# Patient Record
Sex: Male | Born: 1990 | Hispanic: No | Marital: Single | Smoking: Former smoker
Health system: Southern US, Community
[De-identification: ages and names within clinical notes are randomized; demographics above are authoritative.]

## PROBLEM LIST (undated history)

## (undated) DIAGNOSIS — K219 Gastro-esophageal reflux disease without esophagitis: Secondary | ICD-10-CM

## (undated) DIAGNOSIS — I499 Cardiac arrhythmia, unspecified: Secondary | ICD-10-CM

## (undated) DIAGNOSIS — F32A Depression, unspecified: Secondary | ICD-10-CM

## (undated) DIAGNOSIS — F419 Anxiety disorder, unspecified: Secondary | ICD-10-CM

## (undated) HISTORY — DX: Depression, unspecified: F32.A

## (undated) HISTORY — DX: Cardiac arrhythmia, unspecified: I49.9

## (undated) HISTORY — DX: Gastro-esophageal reflux disease without esophagitis: K21.9

## (undated) HISTORY — PX: FRACTURE SURGERY: SHX138

---

## 2019-08-25 ENCOUNTER — Other Ambulatory Visit: Payer: Self-pay

## 2019-08-25 ENCOUNTER — Encounter (HOSPITAL_COMMUNITY): Payer: Self-pay | Admitting: Emergency Medicine

## 2019-08-25 ENCOUNTER — Emergency Department (HOSPITAL_COMMUNITY)
Admission: EM | Admit: 2019-08-25 | Discharge: 2019-08-25 | Disposition: A | Discharge status: Home / Self Care | Payer: Medicaid - Out of State | Attending: Emergency Medicine | Admitting: Emergency Medicine

## 2019-08-25 DIAGNOSIS — F419 Anxiety disorder, unspecified: Secondary | ICD-10-CM | POA: Insufficient documentation

## 2019-08-25 DIAGNOSIS — R0989 Other specified symptoms and signs involving the circulatory and respiratory systems: Secondary | ICD-10-CM | POA: Diagnosis not present

## 2019-08-25 HISTORY — DX: Anxiety disorder, unspecified: F41.9

## 2019-08-25 MED ORDER — HYDROXYZINE HCL 25 MG PO TABS
50.0000 mg | ORAL_TABLET | Freq: Once | ORAL | Status: AC
  Administered 2019-08-25: 50 mg via ORAL
  Filled 2019-08-25: qty 2

## 2019-08-25 MED ORDER — HYDROXYZINE HCL 25 MG PO TABS: 25.0000 mg | ORAL_TABLET | Freq: Four times a day (QID) | ORAL | 0 refills | Status: AC

## 2019-08-25 NOTE — ED Triage Notes (Signed)
Pt st's he had a period of feeling like he couldn't breath about 40 mins ago.  Pt st's it only lasted for a min.  Pt st's he doesn't know if he was having an anxiety attack or allergic reaction to something.   Pt st's he feels much better now

## 2019-08-25 NOTE — Discharge Instructions (Signed)
As discussed, your symptoms are most likely related to an anxiety attack.  I am sending you home with some anxiety medication.  Please take as prescribed and as needed for anxiety.  I have included the number for [facility].  Please call tomorrow to schedule an appointment for further evaluation.  Return to the ER for new or worsening symptoms.

## 2019-08-25 NOTE — ED Provider Notes (Signed)
[facility] EMERGENCY DEPARTMENT Provider Note   CSN: [id number] Arrival date & time: 08/25/19  1530     History Chief Complaint  Patient presents with  . Anxiety    John Prince is a 29 y.o. male with a past medical history significant for anxiety who presents to the ED due to an anxiety attack that occurred 40 minutes prior to arrival.  Patient states he was sitting at his computer when he felt sudden onset of shortness of breath and felt like his throat was closing up.  Symptoms lasted roughly 10 minutes.  Patient admits to a history of anxiety in which this feels similar to his past anxiety attacks.  He was also concerned about a possible allergic reaction.  Denies any new foods, new medicines, new lotions, or new soaps.  Denies associated rash.  Patient notes his symptoms have completely resolved at this point.  [place] and is not currently on any anxiety medication.  Denies abdominal pain, nausea, vomiting, diarrhea.  Denies chest pain and lower extremity edema.  Denies history of blood clots, recent surgeries, recent long immobilizations, and hormonal treatments.  History obtained from patient and past medical records. No interpreter used during encounter.      Past Medical History:  Diagnosis Date  . Anxiety     There are no problems to display for this patient.   Past Surgical History:  Procedure Laterality Date  . FRACTURE SURGERY         No family history on file.  Social History   Tobacco Use  . Smoking status: Former Research scientist (life sciences)  . Smokeless tobacco: Never Used  Substance Use Topics  . Alcohol use: Yes    Comment: rarely  . Drug use: Never    Home Medications Prior to Admission medications   Medication Sig Start Date End Date Taking? Authorizing Provider  hydrOXYzine (ATARAX/VISTARIL) 25 MG tablet Take 1 tablet (25 mg total) by mouth every 6 (six) hours. 08/25/19   Suzy Bouchard, PA-C     Allergies    Patient has no known allergies.  Review of Systems   Review of Systems  Constitutional: Negative for chills and fever.  HENT: Positive for sore throat (felt like throat was closing up).   Respiratory: Positive for chest tightness and shortness of breath (resolved). Negative for cough.   Cardiovascular: Negative for chest pain and leg swelling.  Gastrointestinal: Negative for abdominal pain, diarrhea, nausea and vomiting.  Skin: Negative for rash.  Neurological: Negative for headaches.  All other systems reviewed and are negative.   Physical Exam Updated Vital Signs BP 130/88   Pulse 73   Temp 98.3 F (36.8 C) (Oral)   Resp 16   Ht 5\' 11"  (1.803 m)   Wt 70.3 kg   SpO2 100%   BMI 21.62 kg/m   Physical Exam Vitals and nursing note reviewed.  Constitutional:      General: He is not in acute distress.    Appearance: He is not ill-appearing.  HENT:     Head: Normocephalic.     Mouth/Throat:     Comments: Patent airway. No tonsillar hypertrophy.  Eyes:     Pupils: Pupils are equal, round, and reactive to light.  Cardiovascular:     Rate and Rhythm: Normal rate and regular rhythm.     Pulses: Normal pulses.     Heart sounds: Normal heart sounds. No murmur. No friction rub. No gallop.  Pulmonary:     Effort: Pulmonary effort is normal.     Breath sounds: Normal breath sounds.     Comments: Respirations equal and unlabored, patient able to speak in full sentences, lungs clear to auscultation bilaterally Abdominal:     General: Abdomen is flat. There is no distension.     Palpations: Abdomen is soft.     Tenderness: There is no abdominal tenderness. There is no guarding or rebound.  Musculoskeletal:     Cervical back: Neck supple.     Comments: No lower extremity edema. Negative homan sign bilaterally.   Skin:    General: Skin is warm and dry.     Findings: No rash.  Neurological:     General: No focal deficit present.     Mental Status: He is  alert.  Psychiatric:        Mood and Affect: Mood normal.        Behavior: Behavior normal.     ED Results / Procedures / Treatments   Labs (all labs ordered are listed, but only abnormal results are displayed) Labs Reviewed - No data to display  EKG None  Radiology No results found.  Procedures Procedures (including critical care time)  Medications Ordered in ED Medications  hydrOXYzine (ATARAX/VISTARIL) tablet 50 mg (50 mg Oral Given 08/25/19 1712)    ED Course  I have reviewed the triage vital signs and the nursing notes.  Pertinent labs & imaging results that were available during my care of the patient were reviewed by me and considered in my medical decision making (see chart for details).    MDM Rules/Calculators/A&P                     29 year old male presents to the ED for an evaluation of an anxiety attack that occurred 40 minutes prior to arrival.  Patient states he had sudden onset of shortness of breath and felt like his throat was closing up.  Denies associated rash.  Patient has a history of anxiety and notes this feels similar to his past anxiety attacks.  He recently [place] here from [place] of state and is not currently on any anxiety medication.  Upon arrival, vitals all within normal limits.  Patient is afebrile, not tachycardic or hypoxic.  Patient no acute distress and non-ill-appearing.  Physical exam reassuring.  Lungs clear to auscultation bilaterally.  Airway patent.  No rash.  Abdomen soft, nondistended, nontender.  Low suspicion for allergic reaction at this time.  Will treat for anxiety and reassess patient. Low suspicion for PE/DVT or allergic reaction at this time.  5:57 PM reassessed patient at bedside and he notes that he is ready to be discharged. He admits to improvement in symptoms after hydroxyzine. Will discharge patient with a prescription for hydroxyzine. [facility] number given to patient. Advised patient to call [facility] tomorrow to  schedule an appointment for further evaluation of anxiety. Patient denies SI/HI and hallucinations. No concern for psychosis at this time. Strict ED precautions discussed with patient. Patient states understanding and agrees to plan. Patient discharged home in no acute distress and stable vitals.  Final Clinical Impression(s) / ED Diagnoses Final diagnoses:  Anxiety    Rx / DC Orders ED Discharge Orders         Ordered    hydrOXYzine (ATARAX/VISTARIL) 25 MG tablet  Every 6 hours     08/25/19 1709           Noland Pizano, Merla Riches,  PA-C 08/25/19 1800    Eber Hong, MD 08/26/19 1459

## 2019-11-16 ENCOUNTER — Encounter: Payer: Self-pay | Admitting: Nurse Practitioner

## 2019-11-16 ENCOUNTER — Other Ambulatory Visit: Payer: Self-pay

## 2019-11-16 ENCOUNTER — Ambulatory Visit (INDEPENDENT_AMBULATORY_CARE_PROVIDER_SITE_OTHER): Payer: PRIVATE HEALTH INSURANCE | Admitting: Nurse Practitioner

## 2019-11-16 VITALS — BP 109/72 | HR 85 | Temp 98.8°F | Ht 71.0 in | Wt 142.0 lb

## 2019-11-16 DIAGNOSIS — F419 Anxiety disorder, unspecified: Secondary | ICD-10-CM

## 2019-11-16 DIAGNOSIS — Z0000 Encounter for general adult medical examination without abnormal findings: Secondary | ICD-10-CM | POA: Diagnosis not present

## 2019-11-16 LAB — POCT URINALYSIS DIPSTICK
Bilirubin, UA: NEGATIVE
Blood, UA: NEGATIVE
Glucose, UA: NEGATIVE
Ketones, UA: NEGATIVE
Leukocytes, UA: NEGATIVE
Nitrite, UA: NEGATIVE
Protein, UA: NEGATIVE
Spec Grav, UA: <=1.005 — AB (ref 1.010–1.025)
Urobilinogen, UA: 1 E.U./dL
pH, UA: 5.5 (ref 5.0–8.0)

## 2019-11-16 LAB — POCT GLYCOSYLATED HEMOGLOBIN (HGB A1C)
HbA1c POC (<> result, manual entry): 5 % (ref 4.0–5.6)
HbA1c, POC (controlled diabetic range): 5 % (ref 0.0–7.0)
HbA1c, POC (prediabetic range): 5 % — AB (ref 5.7–6.4)
Hemoglobin A1C: 5 % (ref 4.0–5.6)

## 2019-11-16 LAB — GLUCOSE, POCT (MANUAL RESULT ENTRY): POC Glucose: 103 mg/dl — AB (ref 70–99)

## 2019-11-16 NOTE — Patient Instructions (Signed)

## 2019-11-16 NOTE — Progress Notes (Signed)
John Prince Patient [facility] [address] [place], [place]  [address] Phone:  [phone]   Fax:  [phone]   New Patient Office Visit  Subjective:  Patient ID: John Prince, male    DOB: [birth year]  Age: 29 y.o. MRN: [id number]  CC:  Chief Complaint  Patient presents with   Establish Care    no concerns, just moved from [place].     HPI John Prince presents to establish care. He  has a past medical history of Anxiety, Depression, GERD (gastroesophageal reflux disease), and Irregular heartbeat.   Patient presents for re-evaluation of heart . Patient's current complaints are irregular heart beat. He denies chest pain, dyspnea, lower extremity edema and syncope. He states he is Currently not on any formal treatment. He states he is compliant most of the time with his diet.  Anxiety Patient is here for evaluation of anxiety.  He has the following anxiety symptoms: palpitations, shortness of breath. Onset of symptoms was approximately several years ago.  Symptoms have been gradually worsening since that time. He denies current suicidal and homicidal ideation. Family history significant for depression.Possible organic causes contributing are: none. Risk factors: positive family history in  dad (Bipolar) and cousin Previous treatment includes medication several have caused fogginess. He has taken alprazalam. He is currently on hydroxyzine 25 mg 4 times daily as needed.   He complains of the following medication side effects: none.  He is currently working and is requesting accommodation paperwork to be completed for anxiety flareups  Past Medical History:  Diagnosis Date   Anxiety    Depression    GERD (gastroesophageal reflux disease)    Irregular heartbeat     Past Surgical History:  Procedure Laterality Date   FRACTURE SURGERY      Family History  Problem Relation Age of Onset   Diabetes Maternal Grandmother     Social History   Socioeconomic History    Marital status: Single    Spouse name: Not on file   Number of children: Not on file   Years of education: Not on file   Highest education level: Not on file  Occupational History   Not on file  Tobacco Use   Smoking status: Former Smoker   Smokeless tobacco: Never Used  Substance and Sexual Activity   Alcohol use: Yes    Comment: rarely   Drug use: Never   Sexual activity: Yes    Birth control/protection: None  Other Topics Concern   Not on file  Social History Narrative   Not on file   Social Determinants of Health   Financial Resource Strain:    Difficulty of Paying Living Expenses:   Food Insecurity:    Worried About Programme researcher, broadcasting/film/video in the Last Year:    Barista in the Last Year:   Transportation Needs:    Freight forwarder (Medical):    Lack of Transportation (Non-Medical):   Physical Activity:    Days of Exercise per Week:    Minutes of Exercise per Session:   Stress:    Feeling of Stress :   Social Connections:    Frequency of Communication with Friends and Family:    Frequency of Social Gatherings with Friends and Family:    Attends Religious Services:    Active Member of Clubs or Organizations:    Attends Banker Meetings:    Marital Status:   Intimate Partner Violence:    Fear  of Current or Ex-Partner:    Emotionally Abused:    Physically Abused:    Sexually Abused:     ROS Review of Systems  All other systems reviewed and are negative.   Objective:   Today's Vitals: BP 109/72    Pulse 85    Temp 98.8 F (37.1 C)    Ht 5\' 11"  (1.803 m)    Wt 142 lb 0.4 oz (64.4 kg)    SpO2 99%    BMI 19.81 kg/m   Physical Exam Constitutional:      General: He is not in acute distress.    Appearance: He is normal weight.  HENT:     Nose: Nose normal.     Mouth/Throat:     Mouth: Mucous membranes are moist.  Cardiovascular:     Rate and Rhythm: Normal rate and regular rhythm.     Pulses: Normal  pulses.     Heart sounds: Normal heart sounds.  Pulmonary:     Effort: Pulmonary effort is normal.     Breath sounds: Normal breath sounds.  Abdominal:     General: Abdomen is flat. Bowel sounds are normal.     Palpations: Abdomen is soft.  Musculoskeletal:        General: Normal range of motion.     Cervical back: Normal range of motion.  Skin:    General: Skin is warm and dry.     Comments: Multiple tattoos  Neurological:     General: No focal deficit present.     Mental Status: He is alert and oriented to person, place, and time.  Psychiatric:        Mood and Affect: Mood normal.        Behavior: Behavior normal.        Thought Content: Thought content normal.        Judgment: Judgment normal.     Assessment & Plan:   Problem List Items Addressed This Visit    None    Visit Diagnoses    Healthcare maintenance    -  Primary   Relevant Orders   Urinalysis Dipstick (Completed)   Glucose (CBG) (Completed)   HgB A1c (Completed)   CBC with Differential/Platelet (Completed)   Comp. Metabolic Panel (12) (Completed)   TSH (Completed)   Anxiety       Relevant Orders   Ambulatory referral to Psychiatry Work accommodations papers to be completed for 2 additional 15-minute breaks during the day and once monthly excused absence Continue with hydroxyzine as directed      Outpatient Encounter Medications as of 11/16/2019  Medication Sig   hydrOXYzine (ATARAX/VISTARIL) 25 MG tablet Take 1 tablet (25 mg total) by mouth every 6 (six) hours.   No facility-administered encounter medications on file as of 11/16/2019.    Follow-up: Return in about 6 weeks (around 12/28/2019) for medical record release.   12/30/2019, NP

## 2019-11-17 LAB — COMP. METABOLIC PANEL (12)
AST: 15 IU/L (ref 0–40)
Albumin/Globulin Ratio: 1.9 (ref 1.2–2.2)
Albumin: 5 g/dL (ref 4.1–5.2)
Alkaline Phosphatase: 77 IU/L (ref 48–121)
BUN/Creatinine Ratio: 6 — ABNORMAL LOW (ref 9–20)
BUN: 6 mg/dL (ref 6–20)
Bilirubin Total: 0.5 mg/dL (ref 0.0–1.2)
Calcium: 9.7 mg/dL (ref 8.7–10.2)
Chloride: 102 mmol/L (ref 96–106)
Creatinine, Ser: 0.97 mg/dL (ref 0.76–1.27)
GFR calc Af Amer: 122 mL/min/{1.73_m2} (ref >59)
GFR calc non Af Amer: 106 mL/min/{1.73_m2} (ref >59)
Globulin, Total: 2.6 g/dL (ref 1.5–4.5)
Glucose: 94 mg/dL (ref 65–99)
Potassium: 4.5 mmol/L (ref 3.5–5.2)
Sodium: 140 mmol/L (ref 134–144)
Total Protein: 7.6 g/dL (ref 6.0–8.5)

## 2019-11-17 LAB — CBC WITH DIFFERENTIAL/PLATELET
Basophils Absolute: 0.1 10*3/uL (ref 0.0–0.2)
Basos: 1 %
EOS (ABSOLUTE): 0.2 10*3/uL (ref 0.0–0.4)
Eos: 3 %
Hematocrit: 44.7 % (ref 37.5–51.0)
Hemoglobin: 15.3 g/dL (ref 13.0–17.7)
Immature Grans (Abs): 0 10*3/uL (ref 0.0–0.1)
Immature Granulocytes: 1 %
Lymphocytes Absolute: 2.4 10*3/uL (ref 0.7–3.1)
Lymphs: 40 %
MCH: 30.7 pg (ref 26.6–33.0)
MCHC: 34.2 g/dL (ref 31.5–35.7)
MCV: 90 fL (ref 79–97)
Monocytes Absolute: 0.6 10*3/uL (ref 0.1–0.9)
Monocytes: 11 %
Neutrophils Absolute: 2.7 10*3/uL (ref 1.4–7.0)
Neutrophils: 44 %
Platelets: 258 10*3/uL (ref 150–450)
RBC: 4.99 x10E6/uL (ref 4.14–5.80)
RDW: 13.3 % (ref 11.6–15.4)
WBC: 6 10*3/uL (ref 3.4–10.8)

## 2019-11-17 LAB — TSH: TSH: 1.22 u[IU]/mL (ref 0.450–4.500)

## 2019-12-26 ENCOUNTER — Ambulatory Visit: Payer: PRIVATE HEALTH INSURANCE | Admitting: Nurse Practitioner

## 2019-12-28 ENCOUNTER — Encounter: Payer: Self-pay | Admitting: Nurse Practitioner

## 2019-12-28 ENCOUNTER — Ambulatory Visit (INDEPENDENT_AMBULATORY_CARE_PROVIDER_SITE_OTHER): Payer: Self-pay | Admitting: Nurse Practitioner

## 2019-12-28 ENCOUNTER — Other Ambulatory Visit: Payer: Self-pay

## 2019-12-28 VITALS — BP 111/63 | HR 58 | Temp 98.6°F | Resp 20 | Ht 70.0 in | Wt 145.2 lb

## 2019-12-28 DIAGNOSIS — F419 Anxiety disorder, unspecified: Secondary | ICD-10-CM

## 2019-12-28 DIAGNOSIS — K219 Gastro-esophageal reflux disease without esophagitis: Secondary | ICD-10-CM

## 2019-12-28 MED ORDER — OMEPRAZOLE 20 MG PO CPDR: 20.0000 mg | DELAYED_RELEASE_CAPSULE | Freq: Every day | ORAL | 3 refills | Status: DC

## 2019-12-28 MED ORDER — OMEPRAZOLE 20 MG PO CPDR: 20.0000 mg | DELAYED_RELEASE_CAPSULE | Freq: Every day | ORAL | 3 refills | Status: AC

## 2019-12-28 NOTE — Patient Instructions (Addendum)
Healthy Eating Following a healthy eating pattern may help you to achieve and maintain a healthy body weight, reduce the risk of chronic disease, and live a long and productive life. It is important to follow a healthy eating pattern at an appropriate calorie level for your body. Your nutritional needs should be met primarily through food by choosing a variety of nutrient-rich foods. What are tips for following this plan? Reading food labels  Read labels and choose the following: ? Reduced or low sodium. ? Juices with 100% fruit juice. ? Foods with low saturated fats and high polyunsaturated and monounsaturated fats. ? Foods with whole grains, such as whole wheat, cracked wheat, brown rice, and wild rice. ? Whole grains that are fortified with folic acid. This is recommended for women who are pregnant or who want to become pregnant.  Read labels and avoid the following: ? Foods with a lot of added sugars. These include foods that contain brown sugar, corn sweetener, corn syrup, dextrose, fructose, glucose, high-fructose corn syrup, honey, invert sugar, lactose, malt syrup, maltose, molasses, raw sugar, sucrose, trehalose, or turbinado sugar.  Do not eat more than the following amounts of added sugar per day:  6 teaspoons (25 g) for women.  9 teaspoons (38 g) for men. ? Foods that contain processed or refined starches and grains. ? Refined grain products, such as white flour, degermed cornmeal, white bread, and white rice. Shopping  Choose nutrient-rich snacks, such as vegetables, whole fruits, and nuts. Avoid high-calorie and high-sugar snacks, such as potato chips, fruit snacks, and candy.  Use oil-based dressings and spreads on foods instead of solid fats such as butter, stick margarine, or cream cheese.  Limit pre-made sauces, mixes, and "instant" products such as flavored rice, instant noodles, and ready-made pasta.  Try more plant-protein sources, such as tofu, tempeh, black beans,  edamame, lentils, nuts, and seeds.  Explore eating plans such as the Mediterranean diet or vegetarian diet. Cooking  Use oil to saut or stir-fry foods instead of solid fats such as butter, stick margarine, or lard.  Try baking, boiling, grilling, or broiling instead of frying.  Remove the fatty part of meats before cooking.  Steam vegetables in water or broth. Meal planning   At meals, imagine dividing your plate into fourths: ? One-half of your plate is fruits and vegetables. ? One-fourth of your plate is whole grains. ? One-fourth of your plate is protein, especially lean meats, poultry, eggs, tofu, beans, or nuts.  Include low-fat dairy as part of your daily diet. Lifestyle  Choose healthy options in all settings, including home, work, school, restaurants, or stores.  Prepare your food safely: ? Wash your hands after handling raw meats. ? Keep food preparation surfaces clean by regularly washing with hot, soapy water. ? Keep raw meats separate from ready-to-eat foods, such as fruits and vegetables. ? Cook seafood, meat, poultry, and eggs to the recommended internal temperature. ? Store foods at safe temperatures. In general:  Keep cold foods at 59F (4.4C) or below.  Keep hot foods at 159F (60C) or above.  Keep your freezer at [facility] (-17.8C) or below.  Foods are no longer safe to eat when they have been between the temperatures of 40-159F (4.4-60C) for more than 2 hours. What foods should I eat? Fruits Aim to eat 2 cup-equivalents of fresh, canned (in natural juice), or frozen fruits each day. Examples of 1 cup-equivalent of fruit include 1 small apple, 8 large strawberries, 1 cup canned fruit,  cup  dried fruit, or 1 cup 100% juice. Vegetables Aim to eat 2-3 cup-equivalents of fresh and frozen vegetables each day, including different varieties and colors. Examples of 1 cup-equivalent of vegetables include 2 medium carrots, 2 cups raw, leafy greens, 1 cup chopped  vegetable (raw or cooked), or 1 medium baked potato. Grains Aim to eat 6 ounce-equivalents of whole grains each day. Examples of 1 ounce-equivalent of grains include 1 slice of bread, 1 cup ready-to-eat cereal, 3 cups popcorn, or  cup cooked rice, pasta, or cereal. Meats and other proteins Aim to eat 5-6 ounce-equivalents of protein each day. Examples of 1 ounce-equivalent of protein include 1 egg, 1/2 cup nuts or seeds, or 1 tablespoon (16 g) peanut butter. A cut of meat or fish that is the size of a deck of cards is about 3-4 ounce-equivalents.  Of the protein you eat each week, try to have at least 8 ounces come from seafood. This includes salmon, trout, herring, and anchovies. Dairy Aim to eat 3 cup-equivalents of fat-free or low-fat dairy each day. Examples of 1 cup-equivalent of dairy include 1 cup (240 mL) milk, 8 ounces (250 g) yogurt, 1 ounces (44 g) natural cheese, or 1 cup (240 mL) fortified soy milk. Fats and oils  Aim for about 5 teaspoons (21 g) per day. Choose monounsaturated fats, such as canola and olive oils, avocados, peanut butter, and most nuts, or polyunsaturated fats, such as sunflower, corn, and soybean oils, walnuts, pine nuts, sesame seeds, sunflower seeds, and flaxseed. Beverages  Aim for six 8-oz glasses of water per day. Limit coffee to three to five 8-oz cups per day.  Limit caffeinated beverages that have added calories, such as soda and energy drinks.  Limit alcohol intake to no more than 1 drink a day for nonpregnant women and 2 drinks a day for men. One drink equals 12 oz of beer (355 mL), 5 oz of wine (148 mL), or 1 oz of hard liquor (44 mL). Seasoning and other foods  Avoid adding excess amounts of salt to your foods. Try flavoring foods with herbs and spices instead of salt.  Avoid adding sugar to foods.  Try using oil-based dressings, sauces, and spreads instead of solid fats. This information is based on general [place] nutrition guidelines. For more  information, visit [employer].es. Exact amounts may vary based on your nutrition needs. Summary  A healthy eating plan may help you to maintain a healthy weight, reduce the risk of chronic diseases, and stay active throughout your life.  Plan your meals. Make sure you eat the right portions of a variety of nutrient-rich foods.  Try baking, boiling, grilling, or broiling instead of frying.  Choose healthy options in all settings, including home, work, school, restaurants, or stores. This information is not intended to replace advice given to you by your health care provider. Make sure you discuss any questions you have with your health care provider. Document Revised: 08/03/2017 Document Reviewed: 08/03/2017 Elsevier Patient Education  [place], Adult After being diagnosed with an anxiety disorder, you may be relieved to know why you have felt or behaved a certain way. You may also feel overwhelmed about the treatment ahead and what it will mean for your life. With care and support, you can manage this condition and recover from it. How to manage lifestyle changes Managing stress and anxiety  Stress is your body's reaction to life changes and events, both good and bad. Most stress will last just  a few hours, but stress can be ongoing and can lead to more than just stress. Although stress can play a major role in anxiety, it is not the same as anxiety. Stress is usually caused by something external, such as a deadline, test, or competition. Stress normally passes after the triggering event has ended.  Anxiety is caused by something internal, such as imagining a terrible outcome or worrying that something will go wrong that will devastate you. Anxiety often does not go away even after the triggering event is over, and it can become long-term (chronic) worry. It is important to understand the differences between stress and anxiety and to manage your stress  effectively so that it does not lead to an anxious response. Talk with your health care provider or a counselor to learn more about reducing anxiety and stress. He or she may suggest tension reduction techniques, such as:  Music therapy. This can include creating or listening to music that you enjoy and that inspires you.  Mindfulness-based meditation. This involves being aware of your normal breaths while not trying to control your breathing. It can be done while sitting or walking.  Centering prayer. This involves focusing on a word, phrase, or sacred image that means something to you and brings you peace.  Deep breathing. To do this, expand your stomach and inhale slowly through your nose. Hold your breath for 3-5 seconds. Then exhale slowly, letting your stomach muscles relax.  Self-talk. This involves identifying thought patterns that lead to anxiety reactions and changing those patterns.  Muscle relaxation. This involves tensing muscles and then relaxing them. Choose a tension reduction technique that suits your lifestyle and personality. These techniques take time and practice. Set aside 5-15 minutes a day to do them. Therapists can offer counseling and training in these techniques. The training to help with anxiety may be covered by some insurance plans. Other things you can do to manage stress and anxiety include:  Keeping a stress/anxiety diary. This can help you learn what triggers your reaction and then learn ways to manage your response.  Thinking about how you react to certain situations. You may not be able to control everything, but you can control your response.  Making time for activities that help you relax and not feeling guilty about spending your time in this way.  Visual imagery and yoga can help you stay calm and relax.  Medicines Medicines can help ease symptoms. Medicines for anxiety include:  Anti-anxiety drugs.  Antidepressants. Medicines are often used as a  primary treatment for anxiety disorder. Medicines will be prescribed by a health care provider. When used together, medicines, psychotherapy, and tension reduction techniques may be the most effective treatment. Relationships Relationships can play a big part in helping you recover. Try to spend more time connecting with trusted friends and family members. Consider going to couples counseling, taking family education classes, or going to family therapy. Therapy can help you and others better understand your condition. How to recognize changes in your anxiety Everyone responds differently to treatment for anxiety. Recovery from anxiety happens when symptoms decrease and stop interfering with your daily activities at home or work. This may mean that you will start to:  Have better concentration and focus. Worry will interfere less in your daily thinking.  Sleep better.  Be less irritable.  Have more energy.  Have improved memory. It is important to recognize when your condition is getting worse. Contact your health care provider if your symptoms interfere  with home or work and you feel like your condition is not improving. Follow these instructions at home: Activity  Exercise. Most adults should do the following: ? Exercise for at least 150 minutes each week. The exercise should increase your heart rate and make you sweat (moderate-intensity exercise). ? Strengthening exercises at least twice a week.  Get the right amount and quality of sleep. Most adults need 7-9 hours of sleep each night. Lifestyle   Eat a healthy diet that includes plenty of vegetables, fruits, whole grains, low-fat dairy products, and lean protein. Do not eat a lot of foods that are high in solid fats, added sugars, or salt.  Make choices that simplify your life.  Do not use any products that contain nicotine or tobacco, such as cigarettes, e-cigarettes, and chewing tobacco. If you need help quitting, ask your health  care provider.  Avoid caffeine, alcohol, and certain over-the-counter cold medicines. These may make you feel worse. Ask your pharmacist which medicines to avoid. General instructions  Take over-the-counter and prescription medicines only as told by your health care provider.  Keep all follow-up visits as told by your health care provider. This is important. Where to find support You can get help and support from these sources:  Self-help groups.  Online and [employer].  A trusted spiritual leader.  Couples counseling.  Family education classes.  Family therapy. Where to find more information You may find that joining a support group helps you deal with your anxiety. The following sources can help you locate counselors or support groups near you:  [place]: www.mentalhealthamerica.net  Anxiety and Depression Association of [place] (ADAA): https://www.clark.net/  National Alliance on Mental Illness (NAMI): www.nami.org Contact a health care provider if you:  Have a hard time staying focused or finishing daily tasks.  Spend many hours a day feeling worried about everyday life.  Become exhausted by worry.  Start to have headaches, feel tense, or have nausea.  Urinate more than normal.  Have diarrhea. Get help right away if you have:  A racing heart and shortness of breath.  Thoughts of hurting yourself or others. If you ever feel like you may hurt yourself or others, or have thoughts about taking your own life, get help right away. You can go to your nearest emergency department or call:  Your local emergency services (911 in the [place]  A suicide crisis helpline, such as the [place] at 386-203-8183. This is open 24 hours a day. Summary  Taking steps to learn and use tension reduction techniques can help calm you and help prevent triggering an anxiety reaction.  When used together, medicines, psychotherapy, and  tension reduction techniques may be the most effective treatment.  Family, friends, and partners can play a big part in helping you recover from an anxiety disorder. This information is not intended to replace advice given to you by your health care provider. Make sure you discuss any questions you have with your health care provider. Document Revised: 09/21/2018 Document Reviewed: 09/21/2018 Elsevier Patient Education  [place].

## 2019-12-28 NOTE — Progress Notes (Addendum)
[facility] Patient [facility] [address] [place], [place]  [address] Phone:  [phone]   Fax:  [phone]   Established Patient Office Visit   Subjective:  Patient ID: John Prince, male    DOB: [birth year]  Age: 29 y.o. MRN: [id number]  CC:  Chief Complaint  Patient presents with  . Follow-up    HPI Bastion A Herbst presents for follow up. He  has a past medical history of Anxiety, Depression, GERD (gastroesophageal reflux disease), and Irregular heartbeat.   Anxiety Patient is here for evaluation of anxiety.  He has the following anxiety symptoms: none. Onset of symptoms was approximately several years ago.  Symptoms have been unchanged since that time. He denies current suicidal and homicidal ideation. Family history significant for anxiety.Possible organic causes contributing are: neuro. Risk factors: positive family history in  Cousin Previous treatment includes individual therapy and medication Hydroxyzine.   He complains of the following medication side effects: none.  He only uses it as needed he does try to be more proactive take his self out of the situation this because of the anxiety and redirected energy.  He did admit that he lost his job because he asked for some work accommodations related to the anxiety.  GERD Paitent complains of heartburn. This has been associated with difficulty swallowing and heartburn.  He denies abdominal bloating, chest pain, cough, deep pressure at base of neck, dysphagia, hematemesis, melena, nausea, unexpected weight loss and wheezing. Symptoms have been present for several Years. He Some difficulty swallowing. He Stable weight. He denies melena, hematochezia, hematemesis, and coffee ground emesis. Medical therapy in the past has included none.  He admits that he has been on omeprazole before.  He admits that he has been bad taste or sensation in his mouth or throat. he also has had an EGD in the past.  He denies diagnosis of esophageal  stricture.  He was just diagnosed with GERD.  Past Medical History:  Diagnosis Date  . Anxiety   . Depression   . GERD (gastroesophageal reflux disease)   . Irregular heartbeat     Past Surgical History:  Procedure Laterality Date  . FRACTURE SURGERY      Family History  Problem Relation Age of Onset  . Diabetes Maternal Grandmother     Social History   Socioeconomic History  . Marital status: Single    Spouse name: Not on file  . Number of children: Not on file  . Years of education: Not on file  . Highest education level: Not on file  Occupational History  . Not on file  Tobacco Use  . Smoking status: Former Games developer  . Smokeless tobacco: Never Used  Substance and Sexual Activity  . Alcohol use: Yes    Comment: rarely  . Drug use: Never  . Sexual activity: Yes    Birth control/protection: None  Other Topics Concern  . Not on file  Social History Narrative  . Not on file   Social Determinants of Health   Financial Resource Strain:   . Difficulty of Paying Living Expenses: Not on file  Food Insecurity:   . Worried About Programme researcher, broadcasting/film/video in the Last Year: Not on file  . Ran Out of Food in the Last Year: Not on file  Transportation Needs:   . Lack of Transportation (Medical): Not on file  . Lack of Transportation (Non-Medical): Not on file  Physical Activity:   . Days of Exercise per  Week: Not on file  . Minutes of Exercise per Session: Not on file  Stress:   . Feeling of Stress : Not on file  Social Connections:   . Frequency of Communication with Friends and Family: Not on file  . Frequency of Social Gatherings with Friends and Family: Not on file  . Attends Religious Services: Not on file  . Active Member of Clubs or Organizations: Not on file  . Attends Banker Meetings: Not on file  . Marital Status: Not on file  Intimate Partner Violence:   . Fear of Current or Ex-Partner: Not on file  . Emotionally Abused: Not on file  .  Physically Abused: Not on file  . Sexually Abused: Not on file    Outpatient Medications Prior to Visit  Medication Sig Dispense Refill  . hydrOXYzine (ATARAX/VISTARIL) 25 MG tablet Take 1 tablet (25 mg total) by mouth every 6 (six) hours. 15 tablet 0   No facility-administered medications prior to visit.    No Known Allergies  ROS Review of Systems  All other systems reviewed and are negative.     Objective:    Physical Exam Vitals reviewed.  Constitutional:      Appearance: Normal appearance. He is normal weight.  HENT:     Head: Normocephalic and atraumatic.     Nose: Nose normal.     Mouth/Throat:     Mouth: Mucous membranes are moist.  Cardiovascular:     Rate and Rhythm: Normal rate and regular rhythm.     Pulses: Normal pulses.     Heart sounds: Normal heart sounds.  Pulmonary:     Effort: Pulmonary effort is normal.     Breath sounds: Normal breath sounds.  Abdominal:     General: Bowel sounds are normal.     Palpations: Abdomen is soft.  Musculoskeletal:        General: Normal range of motion.     Cervical back: Normal range of motion.  Skin:    General: Skin is warm and dry.     Capillary Refill: Capillary refill takes less than 2 seconds.  Neurological:     General: No focal deficit present.     Mental Status: He is alert and oriented to person, place, and time.  Psychiatric:        Mood and Affect: Mood normal.        Behavior: Behavior normal.        Thought Content: Thought content normal.        Judgment: Judgment normal.     BP 111/63   Pulse (!) 58   Temp 98.6 F (37 C)   Resp 20   Ht 5\' 10"  (1.778 m)   Wt 145 lb 3.2 oz (65.9 kg)   SpO2 100%   BMI 20.83 kg/m  Wt Readings from Last 3 Encounters:  12/28/19 145 lb 3.2 oz (65.9 kg)  11/16/19 142 lb 0.4 oz (64.4 kg)  08/25/19 155 lb (70.3 kg)     There are no preventive care reminders to display for this patient.  There are no preventive care reminders to display for this  patient.  Lab Results  Component Value Date   TSH 1.220 11/16/2019   Lab Results  Component Value Date   WBC 6.0 11/16/2019   HGB 15.3 11/16/2019   HCT 44.7 11/16/2019   MCV 90 11/16/2019   PLT 258 11/16/2019   Lab Results  Component Value Date   NA 140 11/16/2019  K 4.5 11/16/2019   GLUCOSE 94 11/16/2019   BUN 6 11/16/2019   CREATININE 0.97 11/16/2019   BILITOT 0.5 11/16/2019   ALKPHOS 77 11/16/2019   AST 15 11/16/2019   PROT 7.6 11/16/2019   ALBUMIN 5.0 11/16/2019   CALCIUM 9.7 11/16/2019   No results found for: CHOL No results found for: HDL No results found for: LDLCALC No results found for: TRIG No results found for: CHOLHDL Lab Results  Component Value Date   HGBA1C 5.0 11/16/2019   HGBA1C 5.0 11/16/2019   HGBA1C 5.0 (A) 11/16/2019   HGBA1C 5.0 11/16/2019      Assessment & Plan:   Problem List Items Addressed This Visit    None    Visit Diagnoses    Anxiety    -  Primary Would like to speak with a counselor ongoing instead of using medication   Relevant Orders   Ambulatory referral to Psychiatry   Gastroesophageal reflux disease, unspecified whether esophagitis present     Discussed diet.  Will trial omeprazole for 1 month.  Patient to call if ineffective for change in regimen   Relevant Medications   omeprazole (PRILOSEC) 20 MG capsule      Meds ordered this encounter  Medications  . DISCONTD: omeprazole (PRILOSEC) 20 MG capsule    Sig: Take 1 capsule (20 mg total) by mouth daily.    Dispense:  90 capsule    Refill:  3    Order Specific Question:   Supervising Provider    Answer:   Quentin Angst [id number]  . omeprazole (PRILOSEC) 20 MG capsule    Sig: Take 1 capsule (20 mg total) by mouth daily.    Dispense:  90 capsule    Refill:  3    Order Specific Question:   Supervising Provider    Answer:   Quentin Angst [[id number]]    Follow-up: Return in about 3 months (around 03/29/2020).    Barbette Merino, NP

## 2020-01-23 ENCOUNTER — Emergency Department: Payer: PRIVATE HEALTH INSURANCE

## 2020-01-23 ENCOUNTER — Other Ambulatory Visit: Payer: Self-pay

## 2020-01-23 ENCOUNTER — Emergency Department
Admission: EM | Admit: 2020-01-23 | Discharge: 2020-01-23 | Disposition: A | Discharge status: Home / Self Care | Payer: PRIVATE HEALTH INSURANCE | Attending: Emergency Medicine | Admitting: Emergency Medicine

## 2020-01-23 DIAGNOSIS — Z87891 Personal history of nicotine dependence: Secondary | ICD-10-CM | POA: Insufficient documentation

## 2020-01-23 DIAGNOSIS — R002 Palpitations: Secondary | ICD-10-CM

## 2020-01-23 LAB — CBC
HCT: 40.1 % (ref 39.0–52.0)
Hemoglobin: 13.8 g/dL (ref 13.0–17.0)
MCH: 31.1 pg (ref 26.0–34.0)
MCHC: 34.4 g/dL (ref 30.0–36.0)
MCV: 90.3 fL (ref 80.0–100.0)
Platelets: 214 10*3/uL (ref 150–400)
RBC: 4.44 MIL/uL (ref 4.22–5.81)
RDW: 12.2 % (ref 11.5–15.5)
WBC: 5.5 10*3/uL (ref 4.0–10.5)
nRBC: 0 % (ref 0.0–0.2)

## 2020-01-23 LAB — BASIC METABOLIC PANEL
Anion gap: 7 (ref 5–15)
BUN: 10 mg/dL (ref 6–20)
CO2: 30 mmol/L (ref 22–32)
Calcium: 9.2 mg/dL (ref 8.9–10.3)
Chloride: 100 mmol/L (ref 98–111)
Creatinine, Ser: 1 mg/dL (ref 0.61–1.24)
GFR calc Af Amer: >60 mL/min (ref >60)
GFR calc non Af Amer: >60 mL/min (ref >60)
Glucose, Bld: 108 mg/dL — ABNORMAL HIGH (ref 70–99)
Potassium: 4.2 mmol/L (ref 3.5–5.1)
Sodium: 137 mmol/L (ref 135–145)

## 2020-01-23 LAB — TROPONIN I (HIGH SENSITIVITY): Troponin I (High Sensitivity): 4 ng/L (ref <18)

## 2020-01-23 NOTE — ED Notes (Signed)
See triage note  Presents with intermittent irregular heart rate  States he has hx of same

## 2020-01-23 NOTE — ED Provider Notes (Signed)
[facility] Emergency Department Provider Note  Time seen: 1:09 PM  I have reviewed the triage vital signs and the nursing notes.   HISTORY  Chief Complaint Palpitations    HPI John Prince is a 29 y.o. male with a past medical history of anxiety, depression, gastric reflux, palpitations, presents to the emergency department for worsening palpitations.  According to the patient for years he has been suffering from palpitations, states before he moved to [place] he saw a cardiologist years ago and had an echocardiogram was diagnosed with mitral valve prolapse as well.  Patient has not followed up with a cardiologist since.  Patient states he has intermittent palpitations however over the past 3 days he feels his symptoms have worsened.  Especially at nighttime.  Patient does admit to heavy caffeine use, has had decreased sleep and did use alcohol yesterday.  Denies any chest pain.  No shortness of breath.   Past Medical History:  Diagnosis Date  . Anxiety   . Depression   . GERD (gastroesophageal reflux disease)   . Irregular heartbeat     There are no problems to display for this patient.   Past Surgical History:  Procedure Laterality Date  . FRACTURE SURGERY      Prior to Admission medications   Medication Sig Start Date End Date Taking? Authorizing Provider  hydrOXYzine (ATARAX/VISTARIL) 25 MG tablet Take 1 tablet (25 mg total) by mouth every 6 (six) hours. 08/25/19   Mannie Stabile, PA-C  omeprazole (PRILOSEC) 20 MG capsule Take 1 capsule (20 mg total) by mouth daily. 12/28/19 12/27/20  Barbette Merino, NP    No Known Allergies  Family History  Problem Relation Age of Onset  . Diabetes Maternal Grandmother     Social History Social History   Tobacco Use  . Smoking status: Former Games developer  . Smokeless tobacco: Never Used  Substance Use Topics  . Alcohol use: Yes    Comment: rarely  . Drug use: Never    Review of  Systems Constitutional: Negative for fever. Cardiovascular: Negative for chest pain.  Positive for palpitations. Respiratory: Negative for shortness of breath. Gastrointestinal: Negative for abdominal pain, vomiting  Musculoskeletal: Negative for musculoskeletal complaints Neurological: Negative for headache All other ROS negative  ____________________________________________   PHYSICAL EXAM:  VITAL SIGNS: ED Triage Vitals  Enc Vitals Group     BP 01/23/20 1123 122/81     Pulse Rate 01/23/20 1123 68     Resp 01/23/20 1123 16     Temp 01/23/20 1123 98.2 F (36.8 C)     Temp Source 01/23/20 1123 Oral     SpO2 01/23/20 1123 99 %     Weight 01/23/20 1124 150 lb (68 kg)     Height 01/23/20 1124 5\' 11"  (1.803 m)     Head Circumference --      Peak Flow --      Pain Score 01/23/20 1124 3     Pain Loc --      Pain Edu? --      Excl. in GC? --     Constitutional: Alert and oriented. Well appearing and in no distress. Eyes: Normal exam ENT      Head: Normocephalic and atraumatic.      Mouth/Throat: Mucous membranes are moist. Cardiovascular: Normal rate, regular rhythm. No murmur Respiratory: Normal respiratory effort without tachypnea nor retractions. Breath sounds are clear  Gastrointestinal: Soft and nontender. No distention.   Musculoskeletal: Nontender with normal  range of motion in all extremities. No lower extremity tenderness or edema. Neurologic:  Normal speech and language. No gross focal neurologic deficits  Skin:  Skin is warm, dry and intact.  Psychiatric: Mood and affect are normal.   ____________________________________________    EKG  EKG viewed and interpreted by myself shows a normal sinus rhythm at 70 bpm with a narrow QRS, right axis deviation, nonspecific ST changes with occasional PVC.  ____________________________________________    RADIOLOGY  Chest x-ray is negative  ____________________________________________   INITIAL IMPRESSION /  ASSESSMENT AND PLAN / ED COURSE  Pertinent labs & imaging results that were available during my care of the patient were reviewed by me and considered in my medical decision making (see chart for details).   Patient presents to the emergency department for palpitations worse over the past 3 days.  Overall the patient appears well, reassuring physical exam, reassuring vitals, lab work is largely reassuring as well including negative troponin.  Patient does have occasional PVC on EKG.  No concerning ST changes.  Overall the patient appears well and I believe the patient is safe for discharge home.  I discussed with the patient limiting caffeine use, obtaining more sleep and limiting alcohol use.  I also discussed cardiology follow-up.  Patient agreeable.  John Prince was evaluated in Emergency Department on 01/23/2020 for the symptoms described in the history of present illness. He was evaluated in the context of the global COVID-19 pandemic, which necessitated consideration that the patient might be at risk for infection with the SARS-CoV-2 virus that causes COVID-19. Institutional protocols and algorithms that pertain to the evaluation of patients at risk for COVID-19 are in a state of rapid change based on information released by regulatory bodies including the CDC and federal and state organizations. These policies and algorithms were followed during the patient's care in the ED.  ____________________________________________   FINAL CLINICAL IMPRESSION(S) / ED DIAGNOSES  Palpitations   Minna Antis, MD 01/23/20 1312

## 2020-01-23 NOTE — ED Triage Notes (Signed)
Pt c/o feeling like his heart is beating irregular and some back pain. Pt is in NAD, denies SOB. Denies pain.Marland Kitchen

## 2020-02-13 ENCOUNTER — Other Ambulatory Visit: Payer: Self-pay | Admitting: Nurse Practitioner

## 2020-02-13 DIAGNOSIS — F419 Anxiety disorder, unspecified: Secondary | ICD-10-CM

## 2020-02-13 DIAGNOSIS — F32A Depression, unspecified: Secondary | ICD-10-CM

## 2020-02-13 NOTE — Progress Notes (Signed)
   [facility] [address] [place], [place]  [address] Phone:  [phone]   Fax:  [phone]  Assessment  Primary Diagnosis & Pertinent Problem List: The encounter diagnosis was Anxiety.  Visit Diagnosis: 1. Anxiety  Previous referral was denied. New referral placed If patient is unable to get in soon and does not desire to walk in to the [employer]. In office social worker has agreed to counsel with the patient until he is able to get more established with a counselor

## 2020-03-09 ENCOUNTER — Telehealth (HOSPITAL_COMMUNITY): Payer: Self-pay | Admitting: Clinical

## 2020-03-09 ENCOUNTER — Ambulatory Visit (HOSPITAL_COMMUNITY): Payer: No Payment, Other | Admitting: Clinical

## 2020-03-09 ENCOUNTER — Other Ambulatory Visit: Payer: Self-pay

## 2020-03-09 NOTE — Telephone Encounter (Signed)
Therapist sent the client a link for the scheduled virtual meeting to his cell phone number via MyChart. Client did not check in. Therapist followed up with an attempted phone call. Therapist was unable to speak with the client and left a voice mail instructing the client to get in contact with the office to reschedule.

## 2020-04-02 ENCOUNTER — Ambulatory Visit: Payer: PRIVATE HEALTH INSURANCE | Admitting: Nurse Practitioner

## 2021-12-11 IMAGING — CR DG CHEST 2V
2 series · 2 of 2 positions shown · non-contrast
Comparison: None.

CLINICAL DATA: Pt c/o feeling like his heart is beating irregular
and some back pain, history of anxiety, GERD, irregular heartbeat,
smoker

EXAM:
CHEST - 2 VIEW

[chest pa]
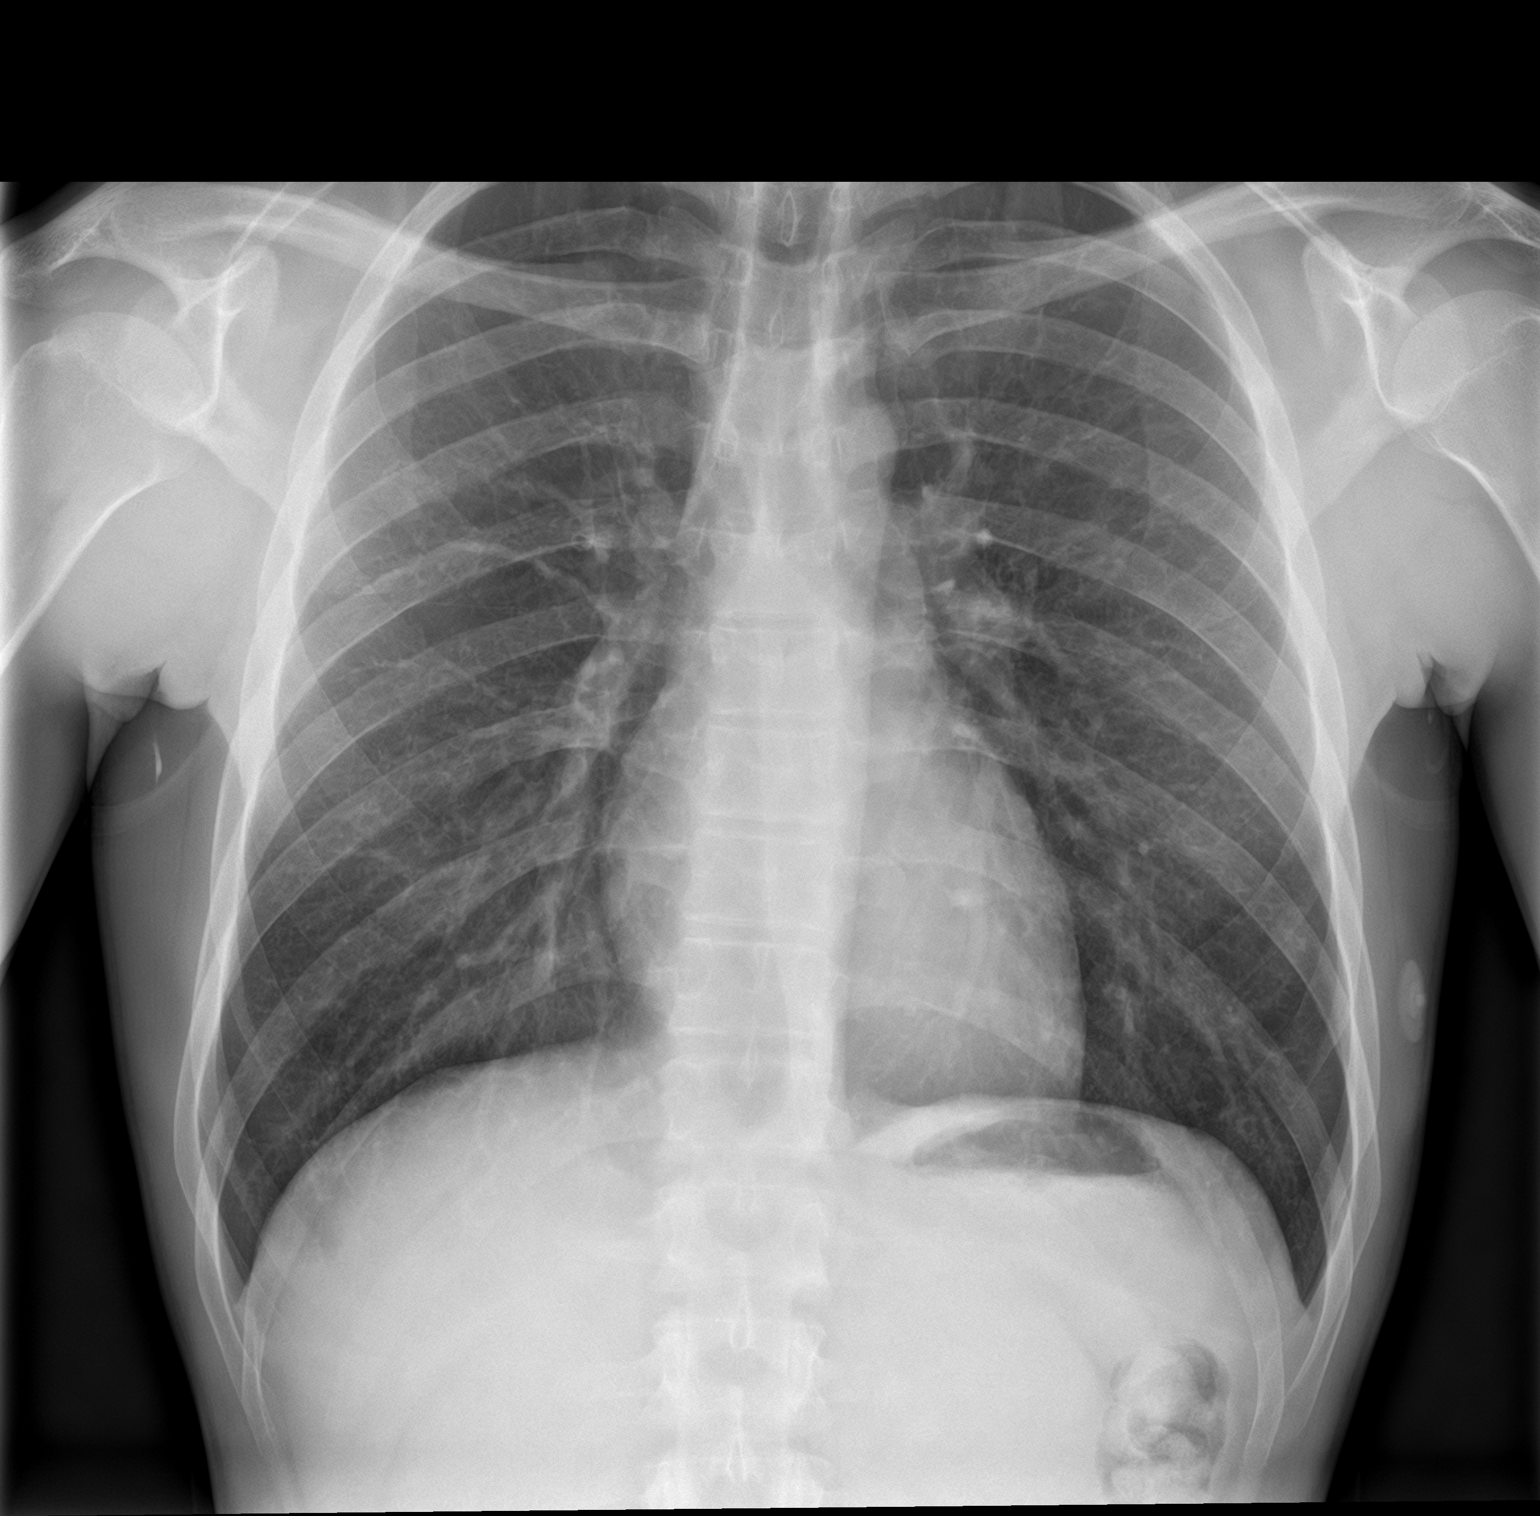

[chest lat]
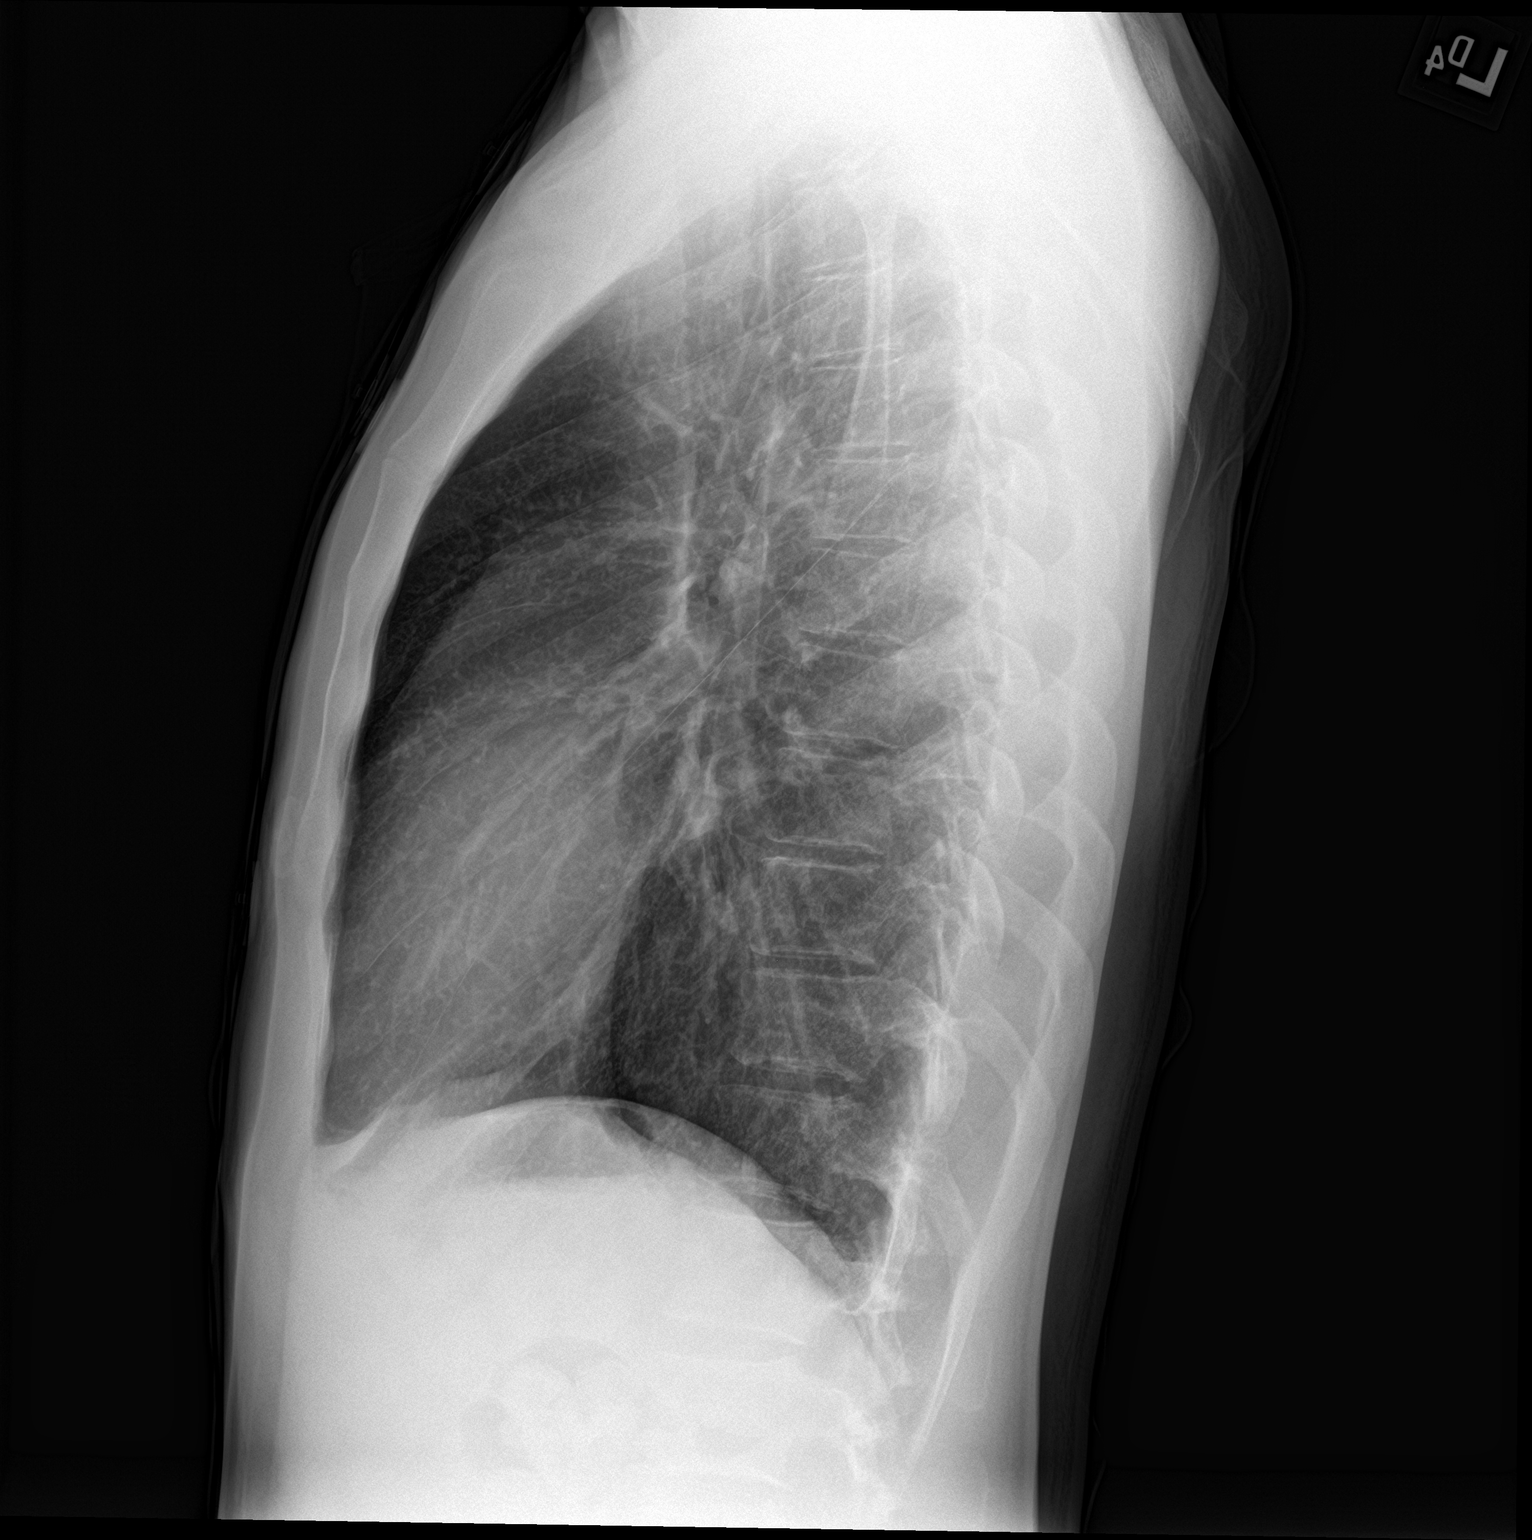

[2 of 2 positions shown; findings below may reference images not displayed]

FINDINGS: The cardiomediastinal contours are within normal limits. The lungs
are clear. No pneumothorax or pleural effusion. No acute finding in
the visualized skeleton.
IMPRESSION: No acute cardiopulmonary process.
